# Patient Record
Sex: Female | Born: 1985 | Race: Black or African American | Hispanic: No | Marital: Single | Smoking: Never smoker
Health system: Southern US, Community
[De-identification: ages and names within clinical notes are randomized; demographics above are authoritative.]

---
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
## 2005-02-01 ENCOUNTER — Encounter (INDEPENDENT_AMBULATORY_CARE_PROVIDER_SITE_OTHER): Payer: Self-pay | Admitting: *Deleted

## 2005-02-01 LAB — CONVERTED CEMR LAB

## 2005-02-10 ENCOUNTER — Ambulatory Visit: Payer: Self-pay | Admitting: Family Medicine

## 2005-02-17 ENCOUNTER — Ambulatory Visit: Payer: Self-pay | Admitting: Family Medicine

## 2005-02-20 ENCOUNTER — Ambulatory Visit (HOSPITAL_COMMUNITY): Admission: RE | Admit: 2005-02-20 | Discharge: 2005-02-20 | Payer: Self-pay | Admitting: Obstetrics and Gynecology

## 2005-03-23 ENCOUNTER — Ambulatory Visit: Payer: Self-pay | Admitting: Family Medicine

## 2005-03-31 ENCOUNTER — Ambulatory Visit: Payer: Self-pay | Admitting: Sports Medicine

## 2005-04-05 ENCOUNTER — Ambulatory Visit: Payer: Self-pay | Admitting: Family Medicine

## 2005-04-06 ENCOUNTER — Ambulatory Visit: Payer: Self-pay | Admitting: Family Medicine

## 2005-04-24 ENCOUNTER — Ambulatory Visit: Payer: Self-pay | Admitting: Sports Medicine

## 2005-05-12 ENCOUNTER — Ambulatory Visit: Payer: Self-pay | Admitting: Family Medicine

## 2005-05-29 ENCOUNTER — Ambulatory Visit: Payer: Self-pay | Admitting: Sports Medicine

## 2005-06-16 ENCOUNTER — Ambulatory Visit: Payer: Self-pay | Admitting: Sports Medicine

## 2005-06-29 ENCOUNTER — Ambulatory Visit: Payer: Self-pay | Admitting: Family Medicine

## 2005-07-07 ENCOUNTER — Ambulatory Visit: Payer: Self-pay | Admitting: Family Medicine

## 2005-07-11 ENCOUNTER — Ambulatory Visit: Payer: Self-pay | Admitting: Family Medicine

## 2005-07-19 ENCOUNTER — Inpatient Hospital Stay (HOSPITAL_COMMUNITY): Admission: AD | Admit: 2005-07-19 | Discharge: 2005-07-21 | Payer: Self-pay | Admitting: Gynecology

## 2005-07-19 ENCOUNTER — Inpatient Hospital Stay (HOSPITAL_COMMUNITY): Admission: AD | Admit: 2005-07-19 | Discharge: 2005-07-19 | Payer: Self-pay | Admitting: *Deleted

## 2005-07-19 ENCOUNTER — Ambulatory Visit: Payer: Self-pay | Admitting: Obstetrics and Gynecology

## 2005-09-21 ENCOUNTER — Ambulatory Visit: Payer: Self-pay | Admitting: Sports Medicine

## 2006-05-31 DIAGNOSIS — L708 Other acne: Secondary | ICD-10-CM | POA: Insufficient documentation

## 2006-06-01 ENCOUNTER — Encounter (INDEPENDENT_AMBULATORY_CARE_PROVIDER_SITE_OTHER): Payer: Self-pay | Admitting: *Deleted

## 2006-07-30 ENCOUNTER — Ambulatory Visit: Payer: Self-pay | Admitting: Family Medicine

## 2006-07-30 LAB — CONVERTED CEMR LAB: Beta hcg, urine, semiquantitative: NEGATIVE

## 2008-03-03 ENCOUNTER — Encounter (INDEPENDENT_AMBULATORY_CARE_PROVIDER_SITE_OTHER): Payer: Self-pay | Admitting: Family Medicine

## 2008-03-03 ENCOUNTER — Ambulatory Visit: Payer: Self-pay | Admitting: Family Medicine

## 2008-03-03 LAB — CONVERTED CEMR LAB
Antibody Screen: NEGATIVE
Basophils Absolute: 0 10*3/uL (ref 0.0–0.1)
Basophils Relative: 0 % (ref 0–1)
Beta hcg, urine, semiquantitative: POSITIVE
Eosinophils Absolute: 0.1 10*3/uL (ref 0.0–0.7)
Eosinophils Relative: 1 % (ref 0–5)
HCT: 35.8 % — ABNORMAL LOW (ref 36.0–46.0)
Hemoglobin: 11.8 g/dL — ABNORMAL LOW (ref 12.0–15.0)
Hepatitis B Surface Ag: NEGATIVE
Lymphocytes Relative: 12 % (ref 12–46)
Lymphs Abs: 1.9 10*3/uL (ref 0.7–4.0)
MCHC: 33 g/dL (ref 30.0–36.0)
MCV: 81.2 fL (ref 78.0–100.0)
Monocytes Absolute: 1.1 10*3/uL — ABNORMAL HIGH (ref 0.1–1.0)
Monocytes Relative: 7 % (ref 3–12)
Neutro Abs: 12 10*3/uL — ABNORMAL HIGH (ref 1.7–7.7)
Neutrophils Relative %: 79 % — ABNORMAL HIGH (ref 43–77)
Platelets: 336 10*3/uL (ref 150–400)
RBC: 4.41 M/uL (ref 3.87–5.11)
RDW: 14.6 % (ref 11.5–15.5)
Rh Type: POSITIVE
Rubella: 82.2 intl units/mL — ABNORMAL HIGH
Sickle Cell Screen: NEGATIVE
WBC: 15.2 10*3/uL — ABNORMAL HIGH (ref 4.0–10.5)

## 2008-03-04 ENCOUNTER — Encounter (INDEPENDENT_AMBULATORY_CARE_PROVIDER_SITE_OTHER): Payer: Self-pay | Admitting: Family Medicine

## 2008-03-04 ENCOUNTER — Ambulatory Visit (HOSPITAL_COMMUNITY): Admission: RE | Admit: 2008-03-04 | Discharge: 2008-03-04 | Payer: Self-pay | Admitting: Internal Medicine

## 2008-03-13 ENCOUNTER — Encounter (INDEPENDENT_AMBULATORY_CARE_PROVIDER_SITE_OTHER): Payer: Self-pay | Admitting: Family Medicine

## 2008-03-13 ENCOUNTER — Ambulatory Visit: Payer: Self-pay | Admitting: Family Medicine

## 2008-03-13 DIAGNOSIS — A599 Trichomoniasis, unspecified: Secondary | ICD-10-CM

## 2008-03-13 LAB — CONVERTED CEMR LAB
Chlamydia, DNA Probe: POSITIVE — AB
GC Probe Amp, Genital: NEGATIVE

## 2008-03-16 ENCOUNTER — Encounter: Payer: Self-pay | Admitting: *Deleted

## 2008-03-16 DIAGNOSIS — A5601 Chlamydial cystitis and urethritis: Secondary | ICD-10-CM | POA: Insufficient documentation

## 2008-03-19 ENCOUNTER — Encounter: Payer: Self-pay | Admitting: *Deleted

## 2008-03-20 ENCOUNTER — Ambulatory Visit: Payer: Self-pay | Admitting: Family Medicine

## 2008-03-25 ENCOUNTER — Telehealth: Payer: Self-pay | Admitting: *Deleted

## 2008-04-01 ENCOUNTER — Encounter (INDEPENDENT_AMBULATORY_CARE_PROVIDER_SITE_OTHER): Payer: Self-pay | Admitting: Family Medicine

## 2008-04-01 ENCOUNTER — Ambulatory Visit: Payer: Self-pay | Admitting: Family Medicine

## 2008-04-01 LAB — CONVERTED CEMR LAB
Chlamydia, DNA Probe: POSITIVE — AB
GC Probe Amp, Genital: NEGATIVE
Hemoglobin: 10.5 g/dL

## 2008-04-02 ENCOUNTER — Encounter (INDEPENDENT_AMBULATORY_CARE_PROVIDER_SITE_OTHER): Payer: Self-pay | Admitting: Family Medicine

## 2008-04-04 ENCOUNTER — Encounter (INDEPENDENT_AMBULATORY_CARE_PROVIDER_SITE_OTHER): Payer: Self-pay | Admitting: Family Medicine

## 2008-04-13 ENCOUNTER — Encounter (INDEPENDENT_AMBULATORY_CARE_PROVIDER_SITE_OTHER): Payer: Self-pay | Admitting: Family Medicine

## 2008-04-13 ENCOUNTER — Ambulatory Visit: Payer: Self-pay | Admitting: Family Medicine

## 2008-04-14 ENCOUNTER — Encounter (INDEPENDENT_AMBULATORY_CARE_PROVIDER_SITE_OTHER): Payer: Self-pay | Admitting: Family Medicine

## 2008-04-30 ENCOUNTER — Ambulatory Visit: Payer: Self-pay | Admitting: Family Medicine

## 2008-04-30 ENCOUNTER — Encounter: Payer: Self-pay | Admitting: Family Medicine

## 2008-04-30 ENCOUNTER — Encounter (INDEPENDENT_AMBULATORY_CARE_PROVIDER_SITE_OTHER): Payer: Self-pay | Admitting: Family Medicine

## 2008-04-30 LAB — CONVERTED CEMR LAB
Chlamydia, DNA Probe: NEGATIVE
GC Probe Amp, Genital: NEGATIVE

## 2008-05-05 ENCOUNTER — Telehealth: Payer: Self-pay | Admitting: Family Medicine

## 2008-05-14 ENCOUNTER — Ambulatory Visit: Payer: Self-pay | Admitting: Family Medicine

## 2008-05-15 ENCOUNTER — Encounter (INDEPENDENT_AMBULATORY_CARE_PROVIDER_SITE_OTHER): Payer: Self-pay | Admitting: Family Medicine

## 2008-05-26 ENCOUNTER — Ambulatory Visit: Payer: Self-pay | Admitting: Family Medicine

## 2008-05-26 ENCOUNTER — Encounter (INDEPENDENT_AMBULATORY_CARE_PROVIDER_SITE_OTHER): Payer: Self-pay | Admitting: Family Medicine

## 2008-05-26 LAB — CONVERTED CEMR LAB
Chlamydia, DNA Probe: NEGATIVE
GC Probe Amp, Genital: NEGATIVE

## 2008-06-05 ENCOUNTER — Ambulatory Visit: Payer: Self-pay | Admitting: Family Medicine

## 2008-06-11 ENCOUNTER — Ambulatory Visit: Payer: Self-pay | Admitting: Family Medicine

## 2008-06-11 LAB — CONVERTED CEMR LAB
Cholesterol, target level: 200 mg/dL
HDL goal, serum: 40 mg/dL
LDL Goal: 160 mg/dL

## 2008-06-16 ENCOUNTER — Ambulatory Visit: Payer: Self-pay | Admitting: Family Medicine

## 2008-06-16 ENCOUNTER — Encounter: Payer: Self-pay | Admitting: Family Medicine

## 2008-06-17 ENCOUNTER — Encounter (INDEPENDENT_AMBULATORY_CARE_PROVIDER_SITE_OTHER): Payer: Self-pay | Admitting: Family Medicine

## 2008-06-18 ENCOUNTER — Telehealth: Payer: Self-pay | Admitting: *Deleted

## 2008-06-18 ENCOUNTER — Encounter (INDEPENDENT_AMBULATORY_CARE_PROVIDER_SITE_OTHER): Payer: Self-pay | Admitting: Family Medicine

## 2008-06-24 ENCOUNTER — Encounter: Payer: Self-pay | Admitting: Family Medicine

## 2008-06-24 ENCOUNTER — Ambulatory Visit: Payer: Self-pay | Admitting: Family Medicine

## 2008-06-25 ENCOUNTER — Telehealth: Payer: Self-pay | Admitting: *Deleted

## 2008-06-27 ENCOUNTER — Inpatient Hospital Stay (HOSPITAL_COMMUNITY): Admission: AD | Admit: 2008-06-27 | Discharge: 2008-06-30 | Payer: Self-pay | Admitting: Family Medicine

## 2008-06-27 ENCOUNTER — Ambulatory Visit: Payer: Self-pay | Admitting: Advanced Practice Midwife

## 2008-08-06 ENCOUNTER — Encounter (INDEPENDENT_AMBULATORY_CARE_PROVIDER_SITE_OTHER): Payer: Self-pay | Admitting: Family Medicine

## 2008-08-06 ENCOUNTER — Ambulatory Visit: Payer: Self-pay | Admitting: Family Medicine

## 2008-08-06 LAB — CONVERTED CEMR LAB: Hemoglobin: 12.9 g/dL

## 2008-09-15 ENCOUNTER — Encounter (INDEPENDENT_AMBULATORY_CARE_PROVIDER_SITE_OTHER): Payer: Self-pay | Admitting: Family Medicine

## 2008-10-09 ENCOUNTER — Ambulatory Visit: Payer: Self-pay | Admitting: Family Medicine

## 2008-10-09 LAB — CONVERTED CEMR LAB: Beta hcg, urine, semiquantitative: POSITIVE

## 2009-01-20 ENCOUNTER — Ambulatory Visit: Payer: Self-pay | Admitting: Family Medicine

## 2009-01-20 ENCOUNTER — Encounter: Payer: Self-pay | Admitting: Family Medicine

## 2009-02-01 LAB — CONVERTED CEMR LAB
Antibody Screen: NEGATIVE
Basophils Absolute: 0.1 10*3/uL (ref 0.0–0.1)
Basophils Relative: 0 % (ref 0–1)
Eosinophils Absolute: 0.2 10*3/uL (ref 0.0–0.7)
Eosinophils Relative: 2 % (ref 0–5)
HCT: 35.4 % — ABNORMAL LOW (ref 36.0–46.0)
Hemoglobin: 11.1 g/dL — ABNORMAL LOW (ref 12.0–15.0)
Hepatitis B Surface Ag: NEGATIVE
Lymphocytes Relative: 15 % (ref 12–46)
Lymphs Abs: 2.2 10*3/uL (ref 0.7–4.0)
MCHC: 31.4 g/dL (ref 30.0–36.0)
MCV: 81.9 fL (ref 78.0–100.0)
Monocytes Absolute: 1.4 10*3/uL — ABNORMAL HIGH (ref 0.1–1.0)
Monocytes Relative: 9 % (ref 3–12)
Neutro Abs: 11.1 10*3/uL — ABNORMAL HIGH (ref 1.7–7.7)
Neutrophils Relative %: 74 % (ref 43–77)
Platelets: 293 10*3/uL (ref 150–400)
RBC: 4.32 M/uL (ref 3.87–5.11)
RDW: 14.2 % (ref 11.5–15.5)
Rh Type: POSITIVE
Rubella: 75.7 intl units/mL — ABNORMAL HIGH
Sickle Cell Screen: NEGATIVE
WBC: 14.9 10*3/uL — ABNORMAL HIGH (ref 4.0–10.5)

## 2009-02-09 ENCOUNTER — Encounter: Payer: Self-pay | Admitting: Family Medicine

## 2009-02-09 ENCOUNTER — Ambulatory Visit: Payer: Self-pay | Admitting: Family Medicine

## 2009-02-09 ENCOUNTER — Other Ambulatory Visit: Admission: RE | Admit: 2009-02-09 | Discharge: 2009-02-09 | Payer: Self-pay | Admitting: Family Medicine

## 2009-02-10 ENCOUNTER — Encounter: Payer: Self-pay | Admitting: Family Medicine

## 2009-02-10 ENCOUNTER — Ambulatory Visit (HOSPITAL_COMMUNITY): Admission: RE | Admit: 2009-02-10 | Discharge: 2009-02-10 | Payer: Self-pay | Admitting: Family Medicine

## 2009-02-10 LAB — CONVERTED CEMR LAB
Chlamydia, DNA Probe: NEGATIVE
GC Probe Amp, Genital: NEGATIVE

## 2009-02-11 ENCOUNTER — Encounter: Payer: Self-pay | Admitting: Family Medicine

## 2009-03-04 ENCOUNTER — Encounter: Payer: Self-pay | Admitting: Family Medicine

## 2009-03-04 ENCOUNTER — Ambulatory Visit: Payer: Self-pay | Admitting: Family Medicine

## 2009-03-04 DIAGNOSIS — R12 Heartburn: Secondary | ICD-10-CM

## 2009-03-05 LAB — CONVERTED CEMR LAB
HCT: 32 % — ABNORMAL LOW (ref 36.0–46.0)
Hemoglobin: 10.3 g/dL — ABNORMAL LOW (ref 12.0–15.0)
MCHC: 32.2 g/dL (ref 30.0–36.0)
MCV: 80 fL (ref 78.0–100.0)
Platelets: 290 10*3/uL (ref 150–400)
RBC: 4 M/uL (ref 3.87–5.11)
RDW: 13.7 % (ref 11.5–15.5)
WBC: 13.5 10*3/uL — ABNORMAL HIGH (ref 4.0–10.5)

## 2009-03-22 ENCOUNTER — Ambulatory Visit: Payer: Self-pay | Admitting: Family Medicine

## 2009-03-22 ENCOUNTER — Encounter: Payer: Self-pay | Admitting: Family Medicine

## 2009-04-07 ENCOUNTER — Ambulatory Visit: Payer: Self-pay | Admitting: Family Medicine

## 2009-04-14 ENCOUNTER — Telehealth: Payer: Self-pay | Admitting: *Deleted

## 2009-04-22 ENCOUNTER — Encounter: Payer: Self-pay | Admitting: Family Medicine

## 2009-04-22 ENCOUNTER — Ambulatory Visit: Payer: Self-pay | Admitting: Family Medicine

## 2009-04-30 ENCOUNTER — Encounter: Payer: Self-pay | Admitting: Family Medicine

## 2009-04-30 ENCOUNTER — Ambulatory Visit: Payer: Self-pay | Admitting: Family Medicine

## 2009-05-01 ENCOUNTER — Encounter: Payer: Self-pay | Admitting: Family Medicine

## 2009-05-03 LAB — CONVERTED CEMR LAB
Chlamydia, DNA Probe: NEGATIVE
GC Probe Amp, Genital: NEGATIVE

## 2009-05-06 ENCOUNTER — Ambulatory Visit: Payer: Self-pay | Admitting: Family Medicine

## 2009-05-07 ENCOUNTER — Encounter: Payer: Self-pay | Admitting: Family Medicine

## 2009-05-09 ENCOUNTER — Inpatient Hospital Stay (HOSPITAL_COMMUNITY): Admission: AD | Admit: 2009-05-09 | Discharge: 2009-05-12 | Payer: Self-pay | Admitting: Obstetrics & Gynecology

## 2009-05-09 ENCOUNTER — Encounter: Payer: Self-pay | Admitting: Obstetrics & Gynecology

## 2009-05-09 ENCOUNTER — Ambulatory Visit: Payer: Self-pay | Admitting: Obstetrics & Gynecology

## 2009-05-10 ENCOUNTER — Encounter: Payer: Self-pay | Admitting: Family Medicine

## 2009-05-20 ENCOUNTER — Telehealth: Payer: Self-pay | Admitting: Family Medicine

## 2009-05-21 ENCOUNTER — Telehealth: Payer: Self-pay | Admitting: Family Medicine

## 2009-06-21 ENCOUNTER — Ambulatory Visit: Payer: Self-pay | Admitting: Family Medicine

## 2009-06-21 ENCOUNTER — Encounter: Payer: Self-pay | Admitting: Family Medicine

## 2010-05-05 NOTE — Progress Notes (Signed)
Summary: Rx Req  Medications Added PERCOCET 5-325 MG TABS (OXYCODONE-ACETAMINOPHEN) one tab by mouth q 4 hrs as needed pain       Phone Note Call from Patient Call back at Home Phone [phone]   Caller: Patient Summary of Call: Pt had c-section about a week ago and is still in pain and wondering if she can get something called in for this?  [facility] is [facility].   Initial call taken by: Clydell Hakim,  May 20, 2009 9:06 AM  Follow-up for Phone Call        will forward to MD. Follow-up by: Theresia Lo RN,  May 20, 2009 11:06 AM    New/Updated Medications: PERCOCET 5-325 MG TABS (OXYCODONE-ACETAMINOPHEN) one tab by mouth q 4 hrs as needed pain Prescriptions: PERCOCET 5-325 MG TABS (OXYCODONE-ACETAMINOPHEN) one tab by mouth q 4 hrs as needed pain  #30 x 0   Entered and Authorized by:   Bobby Rumpf  MD   Signed by:   Bobby Rumpf  MD on 05/20/2009   Method used:   Printed then faxed to ...       [facility] (retail)       [address]       [place], [place]  [id number]       Ph: [phone]       Fax: [phone]   RxID:   [id number]   Appended Document: Rx Req pt notified rx at pharmacy

## 2010-05-05 NOTE — Assessment & Plan Note (Signed)
Summary: OB/KH   Vital Signs:  Patient profile:   25 year old female Weight:      248.7 pounds Temp:     98.3 degrees F oral Pulse rate:   92 / minute Pulse rhythm:   regular BP sitting:   100 / 62  (right arm) Cuff size:   large  Vitals Entered By: Loralee Pacas CMA (May 06, 2009 10:29 AM)  Habits & Providers  Alcohol-Tobacco-Diet     Cigarette Packs/Day: n/a  Allergies: No Known Drug Allergies  Physical Exam  General:  Vital signs reviewed Well-developed, well-nourished patient in NAD.  Awake, cooperative.  Lungs:  clear to auscultation Heart:  RRR without murmurs Abdomen:  gravid, fundal height 39 cm  Extremities:  trace edema bilateral lower extremities    Impression & Recommendations: 23 F G3P2002 @ 37.1 weeks by LMP w/ concordant u/s at 24w 6d based on LMP(08/20/08). No fetal anomalies identified. - Late to [facility].  - Discussed ideal weight gain - 15-20 lbs.  24 lbs weight gain thus far, but weit has been 247 - 250 lbs since she has been getting care, starting around 24 weeks. Weight 247 today. Eating well, no n/v/d. Growth appropriate by fundal height. Will follow. As before, advised increasing activity as tolerated, smaller more frequent meals.  - Reviewed red flags  - Complaining of contractions today, starting yesterday and occurring roughly every 20 minutes.  Discussed with patient that we could check her for cervical dilation, patient stated she felt these were not real contractions based on her 2 previous pregnancies and declined cervical check. - PRENATAL LABS: HIV =  negative/RPR negative/Hgb 11.1/Platelets = 293/Sickle cell screen = negative/ABO / Rh = O pos / ab negative/ Rubella immune/HebBsAg neg/  GC / Chlamydia = negative / negative  GBS = negative 28 wk 1 hr glucola = 124 28 wk Hgb = 10.3  plt = 290 HIV = neg, RPR = neg   Complete Medication List: 1)  Flintstones Complete 60 Mg Chew (Pediatric multivit-minerals-c) .... Take 1 daily 2)   Ferrous Sulfate 325 (65 Fe) Mg Tabs (Ferrous sulfate) .... Take 1 pill once daily for anemia 3)  Colace 100 Mg Caps (Docusate sodium) .... Take 1 tab twice daily for constipation  Other Orders: Urine Culture-[facility] ([id number]) Other OB visit- [facility])  Patient Instructions: 1)  It was great to see you today!  2)  If you start having bleeding, feeling like your water has broken, or regular contractions, go to [facility].  3)  Continue to take your prenatal vitamin.  4)  Follow up with Dr. Wallene Huh in 1 weeks.   Prescriptions: COLACE 100 MG CAPS (DOCUSATE SODIUM) Take 1 tab twice daily for constipation  #60 x 1   Entered by:   Renold Don MD   Authorized by:   Sarah [place] MD   Signed by:   Renold Don MD on 05/06/2009   Method used:   Electronically to        [facility] (retail)       [address]       [place], [place]  [id number]       Ph: [phone]       Fax: [phone]   RxID:   [id number] FERROUS SULFATE 325 (65 FE) MG TABS (FERROUS SULFATE) Take 1 pill once daily for anemia  #30 x 1   Entered by:   Renold Don MD   Authorized by:   Maralyn Sago  [place] MD   Signed by:   Renold Don MD on 05/06/2009   Method used:   Electronically to        [facility] (retail)       [address]       [place], [place]  [id number]       Ph: [phone]       Fax: [phone]   RxID:   [id number]     Flowsheet View for Follow-up Visit    Estimated weeks of       gestation:     37 0/7    Weight:     248.7    Blood pressure:   100 / 62    Hx headache?     No    Nausea/vomiting?   No    Edema?     0    Bleeding?     no    Leakage/discharge?   no    Fetal activity:       yes    Labor symptoms?   few ctx    Fundal height:      39    Taking Vitamins?   Y    Smoking PPD:   n/a    Comment:     contractions that started yesterday    Next visit:     1 wk    Resident:     Iantha Fallen Initial Intake Information     Positive HCG by: [facility]     Race: Black    Marital status: Single    Occupation: Runner, broadcasting/film/video     Number of children at home: 2    [facility]     Newborn[facility] physician: [facility]   FOB Information    Husband/Father of baby: Cynthia Wilkins    FOB occupation Caretaker    Phone: [phone]    FOB Comments: Lives together. Father of other two children   Menstrual History    LMP (date): 08/20/2008    LMP - Character: normal    Menarche: 13 years    Menses interval: 28-30  days    Menstrual flow 5-7 days    On BCP's at conception: no    Appended Document: OB/KH I saw and examined Cynthia Wilkins with Dr. Gwendolyn Grant.  She reports contraction q 20 minutes, declines cervical check at this time.  Given labor precautions.  Follow up 1 week with Dr. Wallene Huh.

## 2010-05-05 NOTE — Progress Notes (Signed)
Summary: letter needed   Phone Note Call from Patient Call back at Home Phone [phone]   Caller: Patient Summary of Call: Pt needs note for proof of pregnancy for Social Service [phone] fax to.  Client statement number is [phone]. Initial call taken by: Clydell Hakim,  April 14, 2009 9:37 AM  Follow-up for Phone Call        Letter faxed to number above. Follow-up by: Garen Grams LPN,  April 15, 2009 8:51 AM

## 2010-05-05 NOTE — Assessment & Plan Note (Signed)
Summary: OB/KH 36.1 wks    Vital Signs:  Patient profile:   25 year old female Weight:      251 pounds Pulse rate:   96 / minute BP sitting:   108 / 70  Vitals Entered By: Jone Baseman CMA (April 30, 2009 2:46 PM)  Habits & Providers  Alcohol-Tobacco-Diet     Cigarette Packs/Day: n/a  Allergies: No Known Drug Allergies   Impression & Recommendations:  Problem # 1:  PREGNANCY, NORMAL, MULTIGRAVIDA (ICD-V22.1) Assessment Unchanged  22 F G3P2002 @ 36.1 weeks by LMP w/ concordant u/s at 24w 6d based on LMP(08/20/08). No fetal anomalies are identified. - Late to [facility]  - Discussed ideal weight gain - 15-20 lbs.  24 lbs weight gain thus far, but weit has been 247 - 250 lbs since she has been getting care, starting around 24 weeks. Weight 251 today. Eating well, no n/v/d. Growth appropriate by fundal height. Will follow. As before, advised increasing activity as tolerated, smaller more frequent meals.  - Reviewed red flags  - GBS, GC/CT today    PRENATAL LABS:  HIV =  negative  RPR = negative Hgb = 11.1 Platelets = 293  Sickle cell screen = negative  ABO / Rh = O pos / ab negative  GC / Chlamydia = negative / negative  GBS = pending at 36 week visit  Rubella = immune  HepBsAg = negative  28 wk 1 hr glucola = 124 28 wk Hgb = 10.3  plt = 290 HIV = neg, RPR = neg   Orders: Medicaid OB visit - [facility] ([address]) GC/Chlamydia-[facility] (87591/87491) Grp B Probe-[facility] ([id number])  Complete Medication List: 1)  Flintstones Complete 60 Mg Chew (Pediatric multivit-minerals-c) .... Take 1 daily  Patient Instructions: 1)  1)  It was great to see you today!  2)  2)  If you start having contractions or bleeding, go to [facility].  3)  3)  Continue to take your prenatal vitamin.  4)  4)  For your heartburn, try over the counter Ranitidine or Zantac.  You can also try smaller, more frequent meals 5)  5)  Follow up in the OB clinic in 1 week, then I will see you 1 week after  that 6)  6) My pager number is [phone] for if you go into labor. Please have them page me when you get admitted into hospital     Flowsheet View for Follow-up Visit    Estimated weeks of       gestation:     36 1/7    Weight:     251    Blood pressure:   108 / 70    Headache:     No    Nausea/vomiting:   No    Edema:     0    Vaginal bleeding:   no    Vaginal discharge:   no    Fundal height:      36    FHR:       140    Fetal activity:     yes    Labor symptoms:   no (occasional pelvic pain)    Taking prenatal vits?   Y    Smoking:     n/a    Next visit:     1 wk    Resident:     Wallene Huh    Preceptor:     will send to [place]

## 2010-05-05 NOTE — Assessment & Plan Note (Signed)
Summary: OB F/U [facility]   Vital Signs:  Patient profile:   25 year old female Weight:      246 pounds Pulse rate:   96 / minute BP sitting:   100 / 68  (left arm) Cuff size:   large  Vitals Entered By: Tessie Fass CMA (April 07, 2009 4:00 PM) CC: OB visit   CC:  OB visit.  Habits & Providers  Alcohol-Tobacco-Diet     Cigarette Packs/Day: n/a  Allergies: No Known Drug Allergies   Impression & Recommendations:  Problem # 1:  PREGNANCY, NORMAL, MULTIGRAVIDA (ICD-V22.1) Assessment Unchanged  22 F G3P2002 @ 32.6 weeks by LMP w/ cpncordant u/s at 24w 6d based on LMP(08/20/08). No fetal anomalies are identified. - Late to [facility].  - Discussed ideal weight gain - 15-20 lbs.  21 lb weight gain total thus far (six lbs down from last visit). Patient states that she was weighed with her boots and heavy winter coat at last visit. Eating well, no n/v/d. Growth appropriate by fundal height. Will follow. As before, advised increasing activity as tolerated, smaller more frequent meals.  - Reviewed red flags    PRENATAL LABS:  HIV =  negative  RPR = negative Hgb = 11.1 Platelets = 293  Sickle cell screen = negative  ABO / Rh = O pos / ab negative  GC / Chlamydia = negative / negative  GBS = pending at 36 week visit  Rubella = immune  HepBsAg = negative  28 wk 1 hr glucola = 124 28 wk Hgb = 10.3  plt = 290 HIV = neg, RPR = neg   Orders: Medicaid OB visit - [facility] ([id number])  Complete Medication List: 1)  Flintstones Complete 60 Mg Chew (Pediatric multivit-minerals-c) .... Take 1 daily  Patient Instructions: 1)  1)  It was great to see you today!  2)  2)  If you start having contractions or bleeding, go to [facility].  3)  3)  Continue to take your prenatal vitamin.  4)  4)  For your heartburn, try over the counter Ranitidine or Zantac.  You can also try smaller, more frequent meals 5)  5)  Follow up with Dr. Wallene Huh in 2 weeks.     Flowsheet View for Follow-up Visit  Estimated weeks of       gestation:     32 6/7    Weight:     246    Blood pressure:   100 / 68    Hx headache?     few mild     Nausea/vomiting?   No    Edema?     0    Bleeding?     no    Leakage/discharge?   no    Fetal activity:       yes    Labor symptoms?   no    Fundal height:      33    FHR:       140s    Taking Vitamins?   Y    Smoking PPD:   n/a    Comment:     weight down 6 lbs today (patient states she was weight with her coat and boots on at last visit.     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 Father of other two children   Menstrual History    LMP (date): 08/20/2008    LMP - Character: normal    Menarche: 13 years    Menses interval: 28-30  days    Menstrual flow 5-7 days    On BCP's at conception: no   Flowsheet View for Follow-up Visit    Estimated weeks of       gestation:     32 6/7    Weight:     246    Blood pressure:   100 / 68    Headache:     few mild     Nausea/vomiting:   No    Edema:     0    Vaginal bleeding:   no    Vaginal discharge:   no    Fundal height:      33    FHR:       140s    Fetal activity:     yes    Labor symptoms:   no    Taking prenatal vits?   Y    Smoking:     n/a    Next visit:     2 wks    Resident:     Wallene Huh    Preceptor:     will send to [facility]    Comment:     weight down 6 lbs today (patient states she was weight with her coat and boots on at last visit.

## 2010-05-05 NOTE — Assessment & Plan Note (Signed)
Summary: POST PARTUM/KH   Vital Signs:  Patient profile:   25 year old female Height:      65.5 inches Weight:      239 pounds BMI:     39.31 BSA:     2.15 Temp:     98.8 degrees F Pulse rate:   80 / minute BP sitting:   106 / 68  Vitals Entered By: Jone Baseman CMA (June 21, 2009 2:10 PM) CC: postpartum Is Patient Diabetic? No Pain Assessment Patient in pain? no        Primary Care Provider:  Bobby Rumpf  MD  CC:  postpartum.  History of Present Illness: 1) Post partum check: G3P3003 s/p stat c-section for NRFHT on 05/09/09 after SOL. Post partum anemia w/ Hgb of 7.0 on discharge. Patient doing well. PHQ-2 negative. Denies anhedonia, tearfulness. Adjusting well to newborn. Bottle feeding (initially breast but became too difficult). Wants to go back to work. Stopped taking iron pills.    Habits & Providers  Alcohol-Tobacco-Diet     Tobacco Status: never  Current Medications (verified): 1)  Flintstones Complete 60 Mg Chew (Pediatric Multivit-Minerals-C) .... Take 1 Daily 2)  Ferrous Sulfate 325 (65 Fe) Mg Tabs (Ferrous Sulfate) .... Take 1 Pill Once Daily For Anemia 3)  Colace 100 Mg Caps (Docusate Sodium) .... Take 1 Tab Twice Daily For Constipation 4)  Percocet 5-325 Mg Tabs (Oxycodone-Acetaminophen) .... One Tab By Mouth Q 4 Hrs As Needed Pain  Allergies (verified): No Known Drug Allergies  Physical Exam  General:  Vital signs reviewed Well-developed, well-nourished patient in NAD.  Awake, cooperative.  Lungs:  clear to auscultation Heart:  RRR without murmurs Abdomen:  non gravid uterus, non tender to palpation, transverse c-section scar healed well.    Impression & Recommendations:  Problem # 1:  POSTPARTUM EXAMINATION (ICD-V24.2) Assessment New  Normal post partum exam. Follow up one year for Pap. No signs post partum depression. Bottle feeding. BTL.   Orders: Postpartum visit- [facility] ([address])  Complete Medication List: 1)  Flintstones Complete 60  Mg Chew (Pediatric multivit-minerals-c) .... Take 1 daily 2)  Ferrous Sulfate 325 (65 Fe) Mg Tabs (Ferrous sulfate) .... Take 1 pill once daily for anemia 3)  Colace 100 Mg Caps (Docusate sodium) .... Take 1 tab twice daily for constipation 4)  Percocet 5-325 Mg Tabs (Oxycodone-acetaminophen) .... One tab by mouth q 4 hrs as needed pain

## 2010-05-05 NOTE — Assessment & Plan Note (Signed)
Summary: ob f/u   Flowsheet View for Follow-up Visit    Estimated weeks of       gestation:     35 0/7    Weight:     249    Blood pressure:   103 / 64    Hx headache?     No    Nausea/vomiting?   No    Edema?     0    Bleeding?     no    Leakage/discharge?   no    Fetal activity:       yes    Labor symptoms?   no (mild occasional pressure)    Fundal height:      36    FHR:       40s    Taking Vitamins?   Y    Smoking PPD:   n/a    Comment:     weight up three lbs from last visit    Next visit:     1 wk    Resident:     Wallene Huh    Preceptor:     Mauricio Po  Physical Examination  Vital Signs:  BP (upright): 103/64  Wt: 249  Last Ht: 65.5 (03/04/2009)  Impression & Recommendations:  Problem # 1:  PREGNANCY, NORMAL, MULTIGRAVIDA (ICD-V22.1) Assessment Unchanged  22 F G3P2002 @ 35.0 weeks by LMP w/ concordant u/s at 24w 6d based on LMP(08/20/08). No fetal anomalies are identified. - Late to [facility].  - Discussed ideal weight gain - 15-20 lbs.  24 lbs weight gain thus far, but weit has been 247 - 250 lbssince she has been getting care, starting around 24 weeks. Eating well, no n/v/d. Growth appropriate by fundal height. Will follow. Consider u/s for growth but no risk factors for IUGR. As before, advised increasing activity as tolerated, smaller more frequent meals.  - Reviewed red flags  - repeat GC/CT, perform GBS at next visit   PRENATAL LABS:  HIV =  negative  RPR = negative Hgb = 11.1 Platelets = 293  Sickle cell screen = negative  ABO / Rh = O pos / ab negative  GC / Chlamydia = negative / negative  GBS = pending at 36 week visit  Rubella = immune  HepBsAg = negative  28 wk 1 hr glucola = 124 28 wk Hgb = 10.3  plt = 290 HIV = neg, RPR = neg   Orders: Medicaid OB visit - [facility] ([id number])  Complete Medication List: 1)  Flintstones Complete 60 Mg Chew (Pediatric multivit-minerals-c) .... Take 1 daily  Patient Instructions: 1)  1)  It was great to see you today!  2)   2)  If you start having contractions or bleeding, go to [facility].  3)  3)  Continue to take your prenatal vitamin.  4)  4)  For your heartburn, try over the counter Ranitidine or Zantac.  You can also try smaller, more frequent meals 5)  5)  Follow up in the OB clinic in 1 week, then I will see you 1 week after that  Flowsheet View for Follow-up Visit    Estimated weeks of       gestation:     35 0/7    Weight:     249    Blood pressure:   103 / 64    Headache:     No    Nausea/vomiting:   No    Edema:     0  Vaginal bleeding:   no    Vaginal discharge:   no    Fundal height:      36    FHR:       40s    Fetal activity:     yes    Labor symptoms:   no (mild occasional pressure)    Taking prenatal vits?   Y    Smoking:     n/a    Next visit:     1 wk    Resident:     Wallene Huh    Preceptor:     Mauricio Po    Comment:     weight up three lbs from last visit

## 2010-05-05 NOTE — Progress Notes (Signed)
Summary: Rx Problem   Phone Note Call from Patient Call back at Home Phone [phone]   Caller: Patient Summary of Call: The pharmacy told her that they could not accept a fax for the rx that was sent yesterday.  It will have to be hand written and pt will have to pick up and bring in.   Initial call taken by: Clydell Hakim,  May 21, 2009 9:15 AM  Follow-up for Phone Call        Will print script.  Follow-up by: Bobby Rumpf  MD,  May 21, 2009 9:20 AM    Prescriptions: PERCOCET 5-325 MG TABS (OXYCODONE-ACETAMINOPHEN) one tab by mouth q 4 hrs as needed pain  #30 x 0   Entered and Authorized by:   Bobby Rumpf  MD   Signed by:   Bobby Rumpf  MD on 05/21/2009   Method used:   Print then Give to Patient   RxID:   [id number]   Appended Document: Rx Problem pt notified that rx is ready to pick up.

## 2010-05-05 NOTE — Letter (Signed)
Summary: Out of Work  [facility]  [address]   [place], [place] [address]   Phone: [phone]  Fax: [phone]    June 21, 2009   Employee:  ARMIDA VICKROY    To Whom It May Concern:   Patient may return to work at her/your earliest convenience starting from today's date.    If you need additional information, please feel free to contact our office.         Sincerely,    Bobby Rumpf  MD

## 2010-06-22 LAB — BASIC METABOLIC PANEL
BUN: 7 mg/dL (ref 6–23)
CO2: 25 mEq/L (ref 19–32)
CO2: 26 mEq/L (ref 19–32)
Calcium: 8 mg/dL — ABNORMAL LOW (ref 8.4–10.5)
Calcium: 8.1 mg/dL — ABNORMAL LOW (ref 8.4–10.5)
Calcium: 8.2 mg/dL — ABNORMAL LOW (ref 8.4–10.5)
Creatinine, Ser: 0.62 mg/dL (ref 0.4–1.2)
Creatinine, Ser: 0.66 mg/dL (ref 0.4–1.2)
GFR calc Af Amer: >60 mL/min (ref >60)
GFR calc Af Amer: >60 mL/min (ref >60)
GFR calc Af Amer: >60 mL/min (ref >60)
GFR calc non Af Amer: >60 mL/min (ref >60)
GFR calc non Af Amer: >60 mL/min (ref >60)
Sodium: 135 mEq/L (ref 135–145)

## 2010-06-22 LAB — DIFFERENTIAL
Basophils Absolute: 0 10*3/uL (ref 0.0–0.1)
Basophils Absolute: 0 10*3/uL (ref 0.0–0.1)
Basophils Relative: 0 % (ref 0–1)
Eosinophils Absolute: 0.2 10*3/uL (ref 0.0–0.7)
Eosinophils Relative: 2 % (ref 0–5)
Lymphocytes Relative: 14 % (ref 12–46)
Lymphs Abs: 2.1 10*3/uL (ref 0.7–4.0)
Lymphs Abs: 2.3 10*3/uL (ref 0.7–4.0)
Monocytes Absolute: 1.1 10*3/uL — ABNORMAL HIGH (ref 0.1–1.0)
Neutro Abs: 11.5 10*3/uL — ABNORMAL HIGH (ref 1.7–7.7)

## 2010-06-22 LAB — CBC
HCT: 21.4 % — ABNORMAL LOW (ref 36.0–46.0)
HCT: 22.1 % — ABNORMAL LOW (ref 36.0–46.0)
HCT: 34.9 % — ABNORMAL LOW (ref 36.0–46.0)
Hemoglobin: 11.2 g/dL — ABNORMAL LOW (ref 12.0–15.0)
Hemoglobin: 7.2 g/dL — ABNORMAL LOW (ref 12.0–15.0)
MCHC: 32 g/dL (ref 30.0–36.0)
MCHC: 32.1 g/dL (ref 30.0–36.0)
MCHC: 32.3 g/dL (ref 30.0–36.0)
MCV: 80.3 fL (ref 78.0–100.0)
MCV: 80.4 fL (ref 78.0–100.0)
Platelets: 227 10*3/uL (ref 150–400)
Platelets: 231 10*3/uL (ref 150–400)
Platelets: 272 10*3/uL (ref 150–400)
RBC: 2.72 MIL/uL — ABNORMAL LOW (ref 3.87–5.11)
RBC: 4.35 MIL/uL (ref 3.87–5.11)
RDW: 15.1 % (ref 11.5–15.5)
RDW: 15.5 % (ref 11.5–15.5)
RDW: 16.2 % — ABNORMAL HIGH (ref 11.5–15.5)
WBC: 14.6 10*3/uL — ABNORMAL HIGH (ref 4.0–10.5)
WBC: 15 10*3/uL — ABNORMAL HIGH (ref 4.0–10.5)

## 2010-06-22 LAB — URINALYSIS, MICROSCOPIC ONLY
Glucose, UA: NEGATIVE mg/dL
Nitrite: NEGATIVE
Specific Gravity, Urine: >1.03 — ABNORMAL HIGH (ref 1.005–1.030)
pH: 5.5 (ref 5.0–8.0)

## 2010-06-22 LAB — ABO/RH: ABO/RH(D): O POS

## 2010-06-22 LAB — RPR: RPR Ser Ql: NONREACTIVE

## 2010-07-06 LAB — GLUCOSE, CAPILLARY: Glucose-Capillary: 115 mg/dL — ABNORMAL HIGH (ref 70–99)

## 2010-07-14 LAB — RPR: RPR Ser Ql: NONREACTIVE

## 2010-07-14 LAB — CBC
Platelets: 304 10*3/uL (ref 150–400)
WBC: 14.1 10*3/uL — ABNORMAL HIGH (ref 4.0–10.5)

## 2011-01-06 LAB — GLUCOSE, CAPILLARY: Glucose-Capillary: 113 mg/dL — ABNORMAL HIGH (ref 70–99)

## 2011-06-19 ENCOUNTER — Other Ambulatory Visit: Payer: Self-pay | Admitting: Family Medicine

## 2012-07-26 ENCOUNTER — Encounter (HOSPITAL_COMMUNITY): Payer: Self-pay | Admitting: *Deleted

## 2012-07-26 ENCOUNTER — Emergency Department (HOSPITAL_COMMUNITY): Payer: BC Managed Care – PPO

## 2012-07-26 ENCOUNTER — Emergency Department (HOSPITAL_COMMUNITY)
Admission: EM | Admit: 2012-07-26 | Discharge: 2012-07-27 | Disposition: A | Discharge status: Home / Self Care | Payer: BC Managed Care – PPO | Attending: Emergency Medicine | Admitting: Emergency Medicine

## 2012-07-26 DIAGNOSIS — R42 Dizziness and giddiness: Secondary | ICD-10-CM | POA: Insufficient documentation

## 2012-07-26 DIAGNOSIS — S0990XA Unspecified injury of head, initial encounter: Secondary | ICD-10-CM | POA: Insufficient documentation

## 2012-07-26 DIAGNOSIS — Y92410 Unspecified street and highway as the place of occurrence of the external cause: Secondary | ICD-10-CM | POA: Insufficient documentation

## 2012-07-26 DIAGNOSIS — R1084 Generalized abdominal pain: Secondary | ICD-10-CM | POA: Insufficient documentation

## 2012-07-26 DIAGNOSIS — Z3202 Encounter for pregnancy test, result negative: Secondary | ICD-10-CM | POA: Insufficient documentation

## 2012-07-26 DIAGNOSIS — Y9389 Activity, other specified: Secondary | ICD-10-CM | POA: Insufficient documentation

## 2012-07-26 LAB — POCT I-STAT, CHEM 8
HCT: 41 % (ref 36.0–46.0)
Hemoglobin: 13.9 g/dL (ref 12.0–15.0)
Potassium: 3.7 mEq/L (ref 3.5–5.1)
Sodium: 140 mEq/L (ref 135–145)

## 2012-07-26 MED ORDER — HYDROCODONE-ACETAMINOPHEN 5-325 MG PO TABS: 2.0000 | ORAL_TABLET | ORAL | Status: AC | PRN

## 2012-07-26 MED ORDER — IOHEXOL 300 MG/ML  SOLN
120.0000 mL | Freq: Once | INTRAMUSCULAR | Status: AC | PRN
  Administered 2012-07-26: 120 mL via INTRAVENOUS

## 2012-07-26 MED ORDER — PROMETHAZINE HCL 25 MG PO TABS
25.0000 mg | ORAL_TABLET | Freq: Four times a day (QID) | ORAL | Status: AC | PRN
Start: 2012-07-26 — End: ?

## 2012-07-26 MED ORDER — METHOCARBAMOL 500 MG PO TABS: 500.0000 mg | ORAL_TABLET | Freq: Two times a day (BID) | ORAL | Status: AC

## 2012-07-26 NOTE — ED Notes (Signed)
Pt to CT

## 2012-07-26 NOTE — ED Notes (Signed)
Pt is in room 1 in the Peds Ed with her children

## 2012-07-26 NOTE — ED Notes (Signed)
Unable to locate pt in triage waiting area.  Per Peds, pt has been placed back in waiting area.  Nurse first will attempt to locate pt.
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
## 2012-07-27 NOTE — ED Notes (Signed)
No changes from previous assessment. °

## 2012-07-27 NOTE — ED Provider Notes (Signed)
Medical screening examination/treatment/procedure(s) were performed by non-physician practitioner and as supervising physician I was immediately available for consultation/collaboration.   Glynn Octave, MD 07/27/12 2350
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
## 2021-11-06 ENCOUNTER — Other Ambulatory Visit: Payer: Self-pay

## 2021-11-06 ENCOUNTER — Emergency Department (HOSPITAL_COMMUNITY)
Admission: EM | Admit: 2021-11-06 | Discharge: 2021-11-06 | Disposition: A | Discharge status: Other Acute Inpatient Hospital | Payer: Self-pay | Attending: Emergency Medicine | Admitting: Emergency Medicine

## 2021-11-06 ENCOUNTER — Emergency Department (HOSPITAL_COMMUNITY): Payer: Self-pay

## 2021-11-06 ENCOUNTER — Encounter (HOSPITAL_COMMUNITY): Payer: Self-pay

## 2021-11-06 DIAGNOSIS — S01111A Laceration without foreign body of right eyelid and periocular area, initial encounter: Secondary | ICD-10-CM | POA: Insufficient documentation

## 2021-11-06 DIAGNOSIS — S058X1A Other injuries of right eye and orbit, initial encounter: Secondary | ICD-10-CM

## 2021-11-06 DIAGNOSIS — Z23 Encounter for immunization: Secondary | ICD-10-CM | POA: Insufficient documentation

## 2021-11-06 DIAGNOSIS — W500XXA Accidental hit or strike by another person, initial encounter: Secondary | ICD-10-CM | POA: Insufficient documentation

## 2021-11-06 LAB — BASIC METABOLIC PANEL
Anion gap: 8 (ref 5–15)
BUN: 10 mg/dL (ref 6–20)
CO2: 24 mmol/L (ref 22–32)
Calcium: 8.2 mg/dL — ABNORMAL LOW (ref 8.9–10.3)
Chloride: 106 mmol/L (ref 98–111)
Creatinine, Ser: 0.95 mg/dL (ref 0.44–1.00)
GFR, Estimated: >60 mL/min (ref >60)
Glucose, Bld: 147 mg/dL — ABNORMAL HIGH (ref 70–99)
Potassium: 3.3 mmol/L — ABNORMAL LOW (ref 3.5–5.1)
Sodium: 138 mmol/L (ref 135–145)

## 2021-11-06 LAB — CBC WITH DIFFERENTIAL/PLATELET
Abs Immature Granulocytes: 0.15 10*3/uL — ABNORMAL HIGH (ref 0.00–0.07)
Basophils Absolute: 0.1 10*3/uL (ref 0.0–0.1)
Basophils Relative: 0 %
Eosinophils Absolute: 0.1 10*3/uL (ref 0.0–0.5)
Eosinophils Relative: 0 %
HCT: 35.7 % — ABNORMAL LOW (ref 36.0–46.0)
Hemoglobin: 11.6 g/dL — ABNORMAL LOW (ref 12.0–15.0)
Immature Granulocytes: 1 %
Lymphocytes Relative: 8 %
Lymphs Abs: 1.6 10*3/uL (ref 0.7–4.0)
MCH: 25.9 pg — ABNORMAL LOW (ref 26.0–34.0)
MCHC: 32.5 g/dL (ref 30.0–36.0)
MCV: 79.7 fL — ABNORMAL LOW (ref 80.0–100.0)
Monocytes Absolute: 1.2 10*3/uL — ABNORMAL HIGH (ref 0.1–1.0)
Monocytes Relative: 6 %
Neutro Abs: 17.1 10*3/uL — ABNORMAL HIGH (ref 1.7–7.7)
Neutrophils Relative %: 85 %
Platelets: 409 10*3/uL — ABNORMAL HIGH (ref 150–400)
RBC: 4.48 MIL/uL (ref 3.87–5.11)
RDW: 15.8 % — ABNORMAL HIGH (ref 11.5–15.5)
WBC: 20.1 10*3/uL — ABNORMAL HIGH (ref 4.0–10.5)
nRBC: 0 % (ref 0.0–0.2)

## 2021-11-06 MED ORDER — TETANUS-DIPHTH-ACELL PERTUSSIS 5-2.5-18.5 LF-MCG/0.5 IM SUSY
0.5000 mL | PREFILLED_SYRINGE | Freq: Once | INTRAMUSCULAR | Status: AC
  Administered 2021-11-06: 0.5 mL via INTRAMUSCULAR
  Filled 2021-11-06: qty 0.5

## 2021-11-06 MED ORDER — MORPHINE SULFATE (PF) 4 MG/ML IV SOLN
4.0000 mg | Freq: Once | INTRAVENOUS | Status: AC
  Administered 2021-11-06: 4 mg via INTRAVENOUS
  Filled 2021-11-06: qty 1
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
## 2021-11-10 MED FILL — Morphine Sulfate Inj 4 MG/ML: INTRAMUSCULAR | Qty: 1 | Status: AC
